# Patient Record
Sex: Female | Born: 1990 | Race: Black or African American | Hispanic: No | Marital: Single | Smoking: Never smoker
Health system: Southern US, Community
[De-identification: ages and names within clinical notes are randomized; demographics above are authoritative.]

## PROBLEM LIST (undated history)

## (undated) DIAGNOSIS — IMO0001 Reserved for inherently not codable concepts without codable children: Secondary | ICD-10-CM

## (undated) DIAGNOSIS — F419 Anxiety disorder, unspecified: Secondary | ICD-10-CM

## (undated) DIAGNOSIS — K219 Gastro-esophageal reflux disease without esophagitis: Secondary | ICD-10-CM

## (undated) DIAGNOSIS — O1490 Unspecified pre-eclampsia, unspecified trimester: Secondary | ICD-10-CM

---

## 2008-08-10 ENCOUNTER — Emergency Department (HOSPITAL_COMMUNITY): Admission: EM | Admit: 2008-08-10 | Discharge: 2008-08-10 | Payer: Self-pay | Admitting: Emergency Medicine

## 2009-10-02 IMAGING — US US OB COMP LESS 14 WK
1 series · 14 of 28 positions shown · non-contrast
Comparison: none

CLINICAL DATA: Early pregnancy.  Abnormal uterine bleeding.

OBSTETRIC <14 WK US AND TRANSVAGINAL OB US
TECHNIQUE: Both transabdominal and transvaginal ultrasound
examinations were performed for complete evaluation of the
gestation as well as the maternal uterus, adnexal regions, and
pelvic cul-de-sac.

[Series 1: unknown · 0.27mm/px · 14 of 36 slices shown]
[im 2/36]
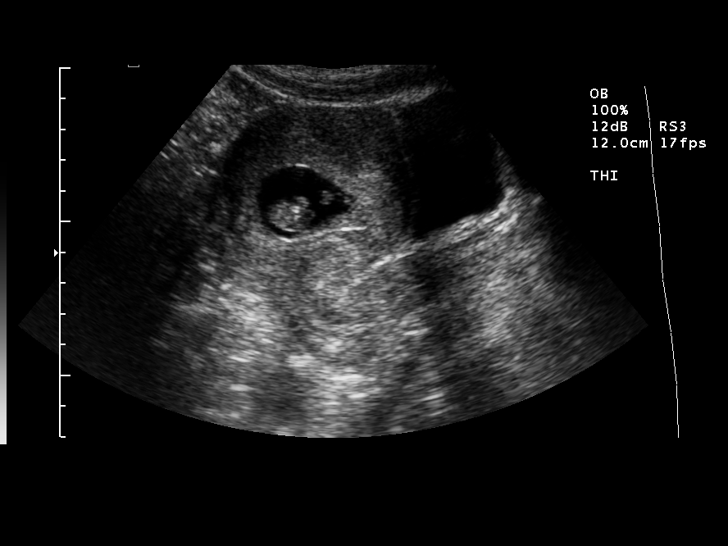
[im 4/36]
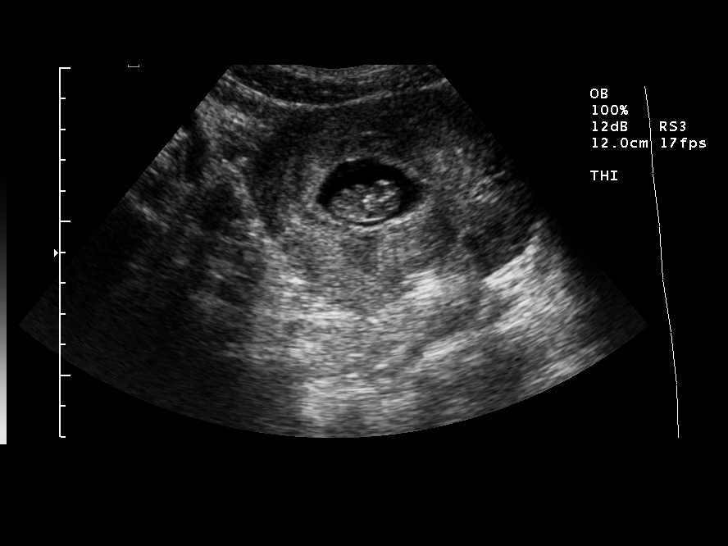
[im 7/36]
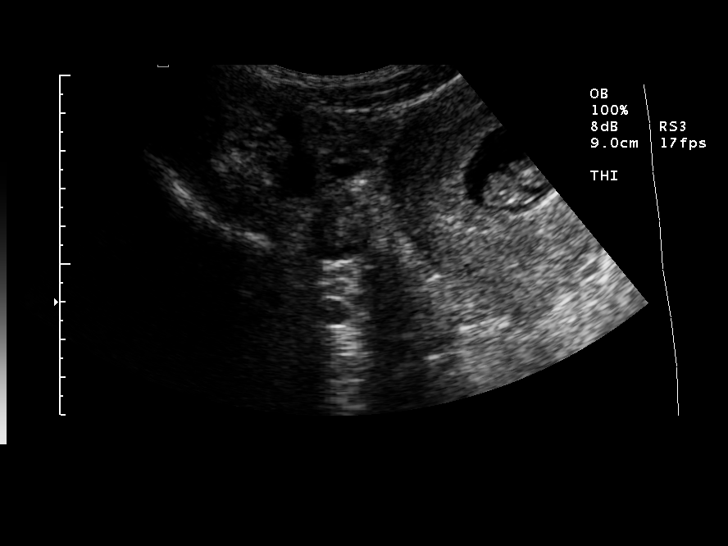
[im 10/36]
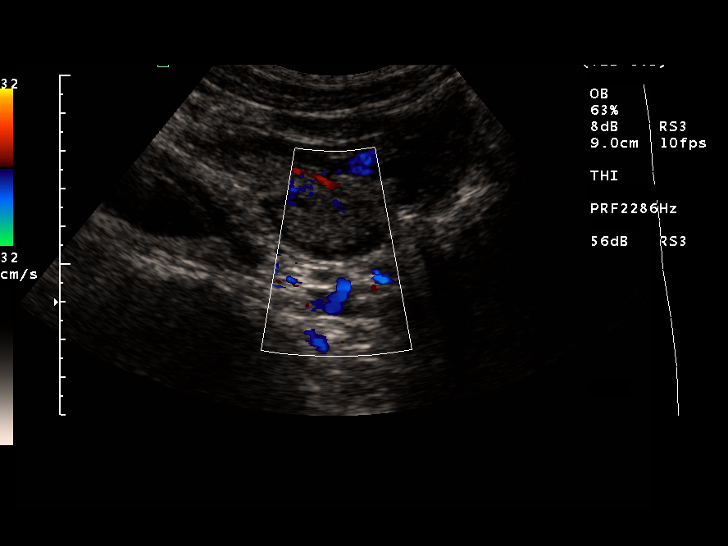
[im 12/36]
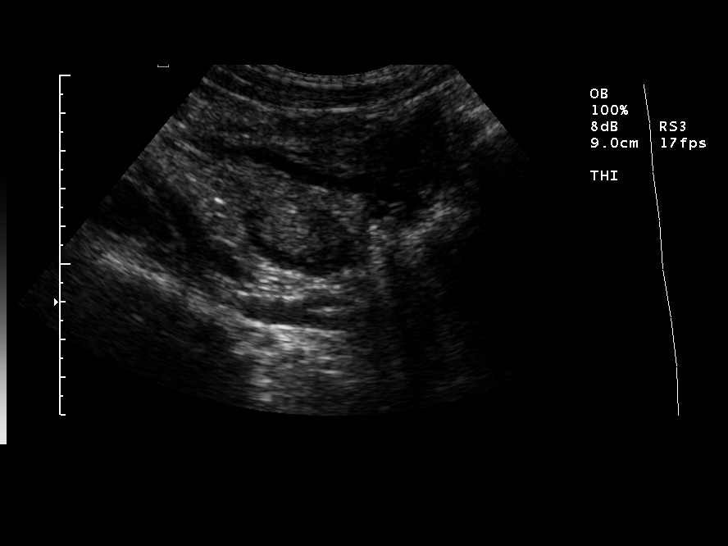
[im 15/36]
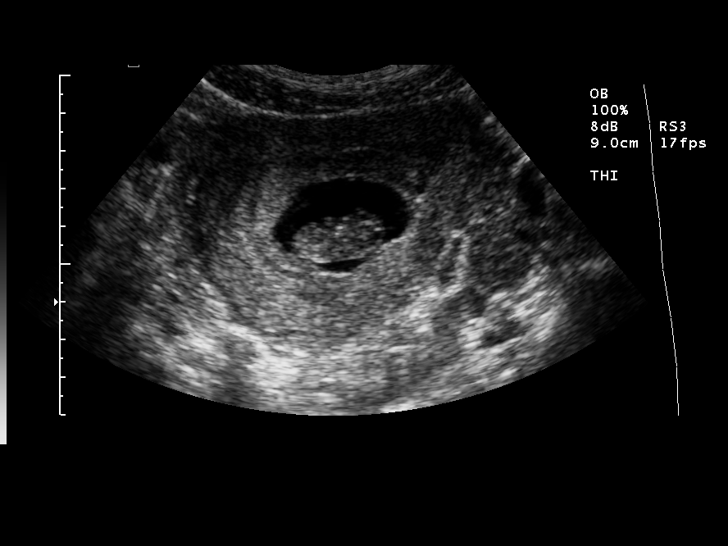
[im 17/36]
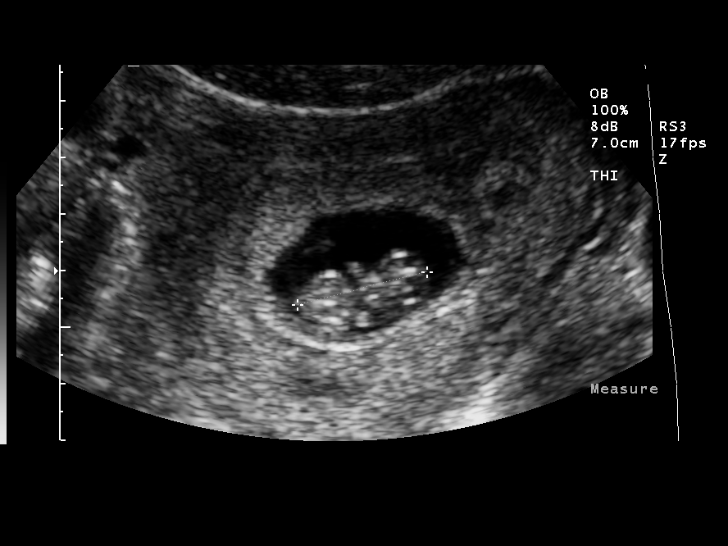
[im 20/36]
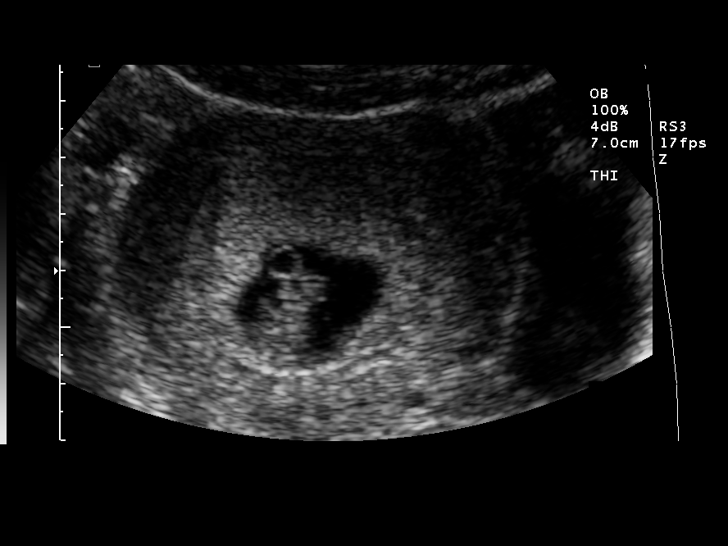
[im 23/36]
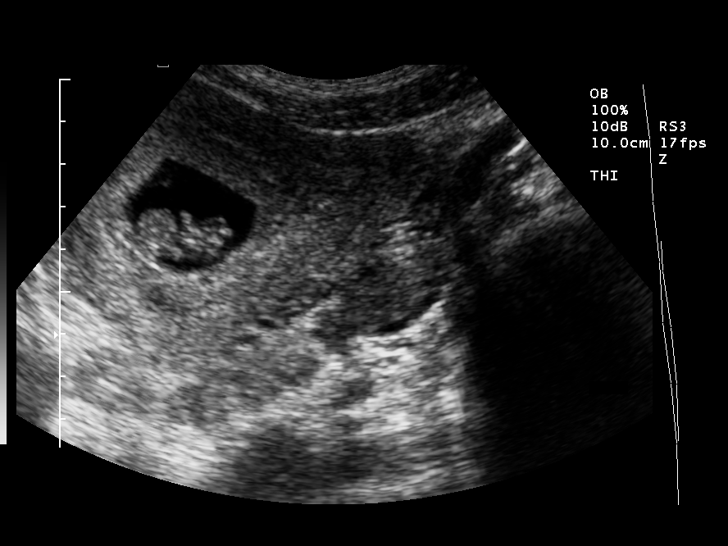
[im 25/36]
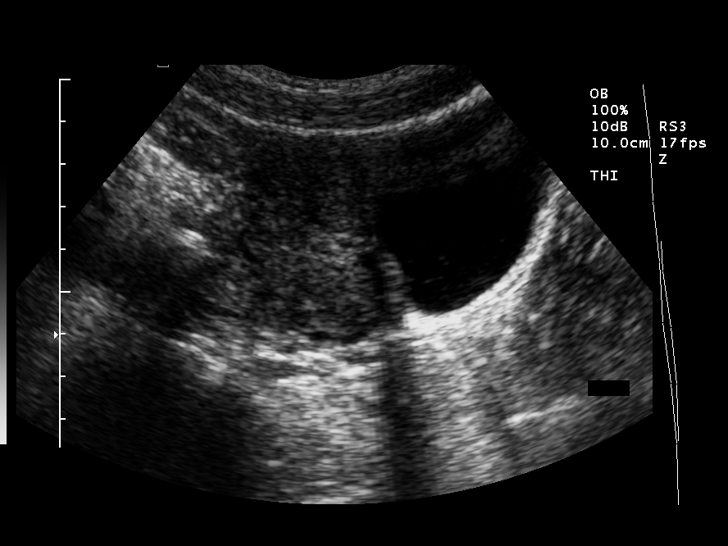
[im 28/36]
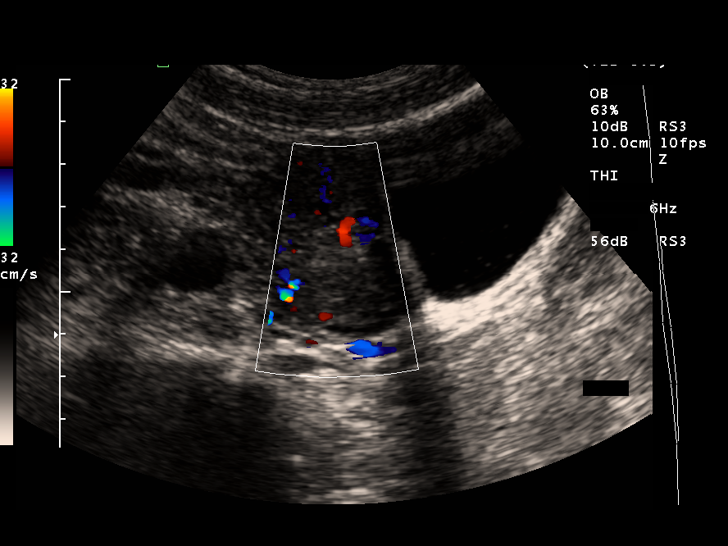
[im 30/36]
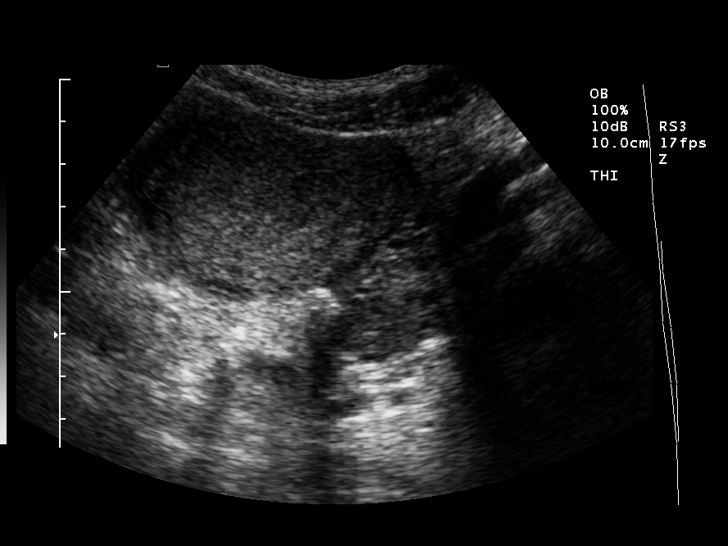
[im 33/36]
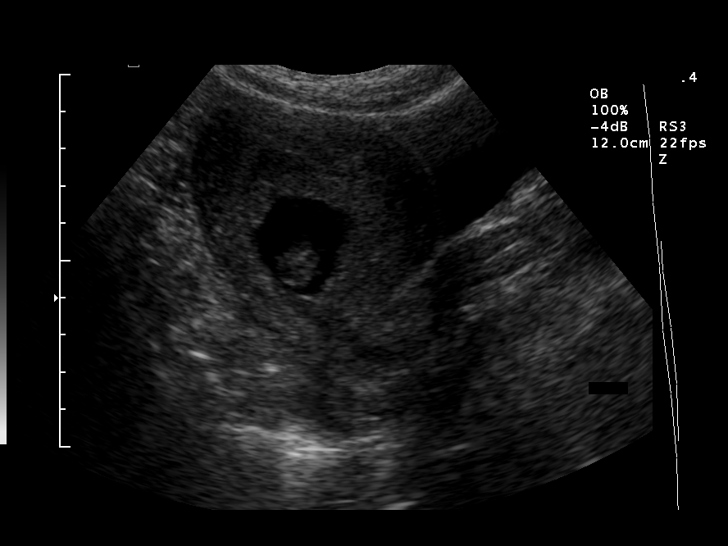
[im 36/36]
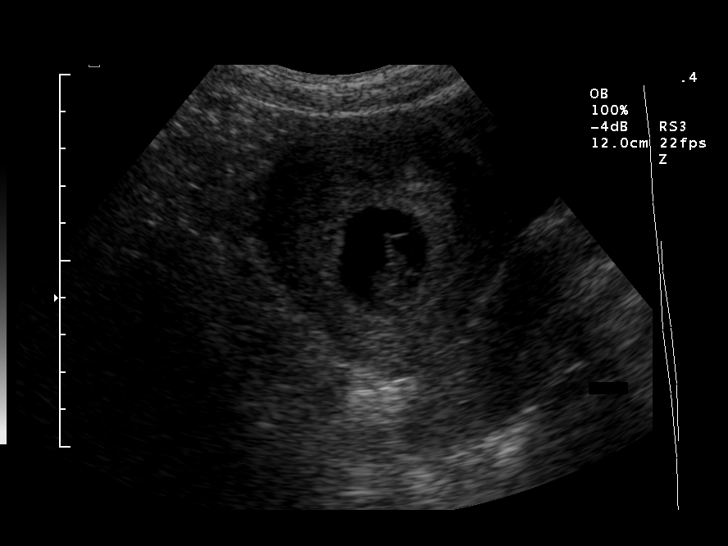

[14 of 28 positions shown; findings below may reference images not displayed]

There is a single intrauterine pregnancy.

Intrauterine gestational sac: Single
Yolk sac: Yes
Embryo: Yes
Cardiac Activity: Yes
Heart Rate: 179 bpm

CRL: 23.9           9   w  1   d          US EDC: 03/14/2009

Maternal uterus/adnexae:
There is no subchorionic hemorrhage.  The ovaries appear normal.
No free fluid.
IMPRESSION: Single intrauterine pregnancy of approximate 9 weeks 1 day
gestation.  No evidence of hemorrhage or other abnormality.

## 2011-12-03 ENCOUNTER — Encounter: Payer: Self-pay | Admitting: *Deleted

## 2011-12-03 ENCOUNTER — Emergency Department (HOSPITAL_BASED_OUTPATIENT_CLINIC_OR_DEPARTMENT_OTHER)
Admission: EM | Admit: 2011-12-03 | Discharge: 2011-12-03 | Disposition: A | Discharge status: Home / Self Care | Payer: PRIVATE HEALTH INSURANCE | Attending: Emergency Medicine | Admitting: Emergency Medicine

## 2011-12-03 DIAGNOSIS — M545 Low back pain, unspecified: Secondary | ICD-10-CM | POA: Insufficient documentation

## 2011-12-03 DIAGNOSIS — N390 Urinary tract infection, site not specified: Secondary | ICD-10-CM | POA: Insufficient documentation

## 2011-12-03 LAB — URINE MICROSCOPIC-ADD ON

## 2011-12-03 LAB — URINALYSIS, ROUTINE W REFLEX MICROSCOPIC
Glucose, UA: NEGATIVE mg/dL
Hgb urine dipstick: NEGATIVE
Specific Gravity, Urine: 1.027 (ref 1.005–1.030)

## 2011-12-03 LAB — PREGNANCY, URINE: Preg Test, Ur: NEGATIVE

## 2011-12-03 MED ORDER — SULFAMETHOXAZOLE-TMP DS 800-160 MG PO TABS: 1.0000 | ORAL_TABLET | Freq: Two times a day (BID) | ORAL | Status: AC

## 2011-12-03 MED ORDER — CYCLOBENZAPRINE HCL 5 MG PO TABS: 5.0000 mg | ORAL_TABLET | Freq: Three times a day (TID) | ORAL | Status: AC | PRN

## 2011-12-03 MED ORDER — SULFAMETHOXAZOLE-TMP DS 800-160 MG PO TABS
1.0000 | ORAL_TABLET | Freq: Once | ORAL | Status: AC
  Administered 2011-12-03: 1 via ORAL
  Filled 2011-12-03: qty 1

## 2011-12-03 MED ORDER — HYDROCODONE-ACETAMINOPHEN 5-325 MG PO TABS: 1.0000 | ORAL_TABLET | Freq: Four times a day (QID) | ORAL | Status: AC | PRN

## 2011-12-03 MED ORDER — HYDROCODONE-ACETAMINOPHEN 5-325 MG PO TABS
1.0000 | ORAL_TABLET | Freq: Once | ORAL | Status: AC
  Administered 2011-12-03: 1 via ORAL
  Filled 2011-12-03: qty 1

## 2011-12-03 NOTE — ED Provider Notes (Signed)
History     CSN: [id number] Arrival date & time: 12/03/2011  9:45 PM   First MD Initiated Contact with Patient 12/03/11 2256      Chief Complaint  Patient presents with  . Back Pain    (Consider location/radiation/quality/duration/timing/severity/associated sxs/prior treatment) HPI Patient has been having pain in her lower back for the last few days. Patient denies any injury. The pain is in her bilateral lower back. It is sharp and seems to increase with movement and palpation. She has had a cough but she has not been short of breath. She denies any dysuria or fevers. She denies any abdominal discomfort. History reviewed. No pertinent past medical history.  History reviewed. No pertinent past surgical history.  History reviewed. No pertinent family history.  History  Substance Use Topics  . Smoking status: Never Smoker   . Smokeless tobacco: Not on file  . Alcohol Use: No    OB History    Grav Para Term Preterm Abortions TAB SAB Ect Mult Living                  Review of Systems  All other systems reviewed and are negative.    Allergies  Ceclor  Home Medications   Current Outpatient Rx  Name Route Sig Dispense Refill  . ETONOGESTREL 68 MG [place] IMPL Subcutaneous Inject 1 each into the skin once.        BP 110/48  Pulse 85  Temp(Src) 98.3 F (36.8 C) (Oral)  Resp 16  Ht 5\' 7"  (1.702 m)  Wt 125 lb (56.7 kg)  BMI 19.58 kg/m2  SpO2 100%  Physical Exam  Nursing note and vitals reviewed. Constitutional: She appears well-developed and well-nourished. No distress.  HENT:  Head: Normocephalic and atraumatic.  Right Ear: External ear normal.  Left Ear: External ear normal.  Eyes: Conjunctivae are normal. Right eye exhibits no discharge. Left eye exhibits no discharge. No scleral icterus.  Neck: Neck supple. No tracheal deviation present.  Cardiovascular: Normal rate, regular rhythm and intact distal pulses.   Pulmonary/Chest: Effort normal and breath sounds  normal. No stridor. No respiratory distress. She has no wheezes. She has no rales. She exhibits no tenderness.  Abdominal: Soft. Bowel sounds are normal. She exhibits no distension and no mass. There is no tenderness. There is no rebound and no guarding.  Musculoskeletal: She exhibits no edema and no tenderness.       No CVA tenderness, mild tenderness bilateral lumbar paraspinal region  Neurological: She is alert. She has normal strength. No sensory deficit. Cranial nerve deficit:  no gross defecits noted. She exhibits normal muscle tone. She displays no seizure activity. Coordination normal.  Skin: Skin is warm and dry. No rash noted.  Psychiatric: She has a normal mood and affect.    ED Course  Procedures (including critical care time)  Labs Reviewed  URINALYSIS, ROUTINE W REFLEX MICROSCOPIC - Abnormal; Notable for the following:    Leukocytes, UA MODERATE (*)    All other components within normal limits  URINE MICROSCOPIC-ADD ON - Abnormal; Notable for the following:    Squamous Epithelial / LPF FEW (*)    Bacteria, UA FEW (*)    All other components within normal limits  PREGNANCY, URINE   No results found.    MDM  Urinalysis suggests a urinary tract infection however she does not have symptoms to suggest pyelonephritis. Symptoms suggest more of a musculoskeletal etiology. Patient will be prescribed antibiotics for the urinary tract infection as  well as pain medications and muscle relaxants.       Celene Kras, MD 12/03/11 2322

## 2011-12-03 NOTE — ED Notes (Signed)
Pt in no resp. Distress.

## 2011-12-03 NOTE — ED Notes (Signed)
C/o lower back pain x 3 days, denies injury. Pt also c/o cold sxs.

## 2011-12-04 NOTE — ED Notes (Signed)
Pts family member called back with questions re: vomitting. Explained to family that pain medication can cause some mild n/v a nd if it persists pt would need to stop taking pain meds and change to Motrin or tylenol.  Family stated understanding

## 2012-03-11 ENCOUNTER — Emergency Department (HOSPITAL_BASED_OUTPATIENT_CLINIC_OR_DEPARTMENT_OTHER)
Admission: EM | Admit: 2012-03-11 | Discharge: 2012-03-11 | Disposition: A | Discharge status: Home / Self Care | Payer: Medicaid Other | Attending: Emergency Medicine | Admitting: Emergency Medicine

## 2012-03-11 ENCOUNTER — Encounter (HOSPITAL_BASED_OUTPATIENT_CLINIC_OR_DEPARTMENT_OTHER): Payer: Self-pay | Admitting: *Deleted

## 2012-03-11 ENCOUNTER — Other Ambulatory Visit: Payer: Self-pay

## 2012-03-11 DIAGNOSIS — R071 Chest pain on breathing: Secondary | ICD-10-CM | POA: Insufficient documentation

## 2012-03-11 DIAGNOSIS — R0789 Other chest pain: Secondary | ICD-10-CM

## 2012-03-11 DIAGNOSIS — R51 Headache: Secondary | ICD-10-CM | POA: Insufficient documentation

## 2012-03-11 DIAGNOSIS — R209 Unspecified disturbances of skin sensation: Secondary | ICD-10-CM | POA: Insufficient documentation

## 2012-03-11 DIAGNOSIS — R0602 Shortness of breath: Secondary | ICD-10-CM | POA: Insufficient documentation

## 2012-03-11 HISTORY — DX: Gastro-esophageal reflux disease without esophagitis: K21.9

## 2012-03-11 HISTORY — DX: Anxiety disorder, unspecified: F41.9

## 2012-03-11 HISTORY — DX: Unspecified pre-eclampsia, unspecified trimester: O14.90

## 2012-03-11 HISTORY — DX: Reserved for inherently not codable concepts without codable children: IMO0001

## 2012-03-11 LAB — D-DIMER, QUANTITATIVE: D-Dimer, Quant: <0.22 ug/mL-FEU (ref 0.00–0.48)

## 2012-03-11 MED ORDER — HYDROCODONE-ACETAMINOPHEN 5-325 MG PO TABS: 2.0000 | ORAL_TABLET | ORAL | Status: AC | PRN

## 2012-03-11 MED ORDER — IBUPROFEN 600 MG PO TABS: 600.0000 mg | ORAL_TABLET | Freq: Four times a day (QID) | ORAL | Status: AC | PRN
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
## 2012-03-11 NOTE — Discharge Instructions (Signed)
Chest Wall Pain Chest wall pain is pain in or around the bones and muscles of your chest. It may take up to 6 weeks to get better. It may take longer if you must stay physically active in your work and activities.  CAUSES  Chest wall pain may happen on its own. However, it may be caused by:  A viral illness like the flu.   Injury.   Coughing.   Exercise.   Arthritis.   Fibromyalgia.   Shingles.  HOME CARE INSTRUCTIONS   Avoid overtiring physical activity. Try not to strain or perform activities that cause pain. This includes any activities using your chest or your abdominal and side muscles, especially if heavy weights are used.   Put ice on the sore area.   Put ice in a plastic bag.   Place a towel between your skin and the bag.   Leave the ice on for 15 to 20 minutes per hour while awake for the first 2 days.   Only take over-the-counter or prescription medicines for pain, discomfort, or fever as directed by your caregiver.  SEEK IMMEDIATE MEDICAL CARE IF:   Your pain increases, or you are very uncomfortable.   You have a fever.   Your chest pain becomes worse.   You have new, unexplained symptoms.   You have nausea or vomiting.   You feel sweaty or lightheaded.   You have a cough with phlegm (sputum), or you cough up blood.  MAKE SURE YOU:   Understand these instructions.   Will watch your condition.   Will get help right away if you are not doing well or get worse.  Document Released: 12/09/2005 Document Revised: 11/28/2011 Document Reviewed: 08/05/2011 [employer] Patient Information 2012 [employer]. 

## 2012-03-11 NOTE — ED Notes (Signed)
Patient states she has had left chest pain for the last 3 days.  Recent cold with a cough.  Describes pain as constant tightness in her left chest.

## 2012-03-11 NOTE — ED Notes (Signed)
Pt asleep when in to d/c-NAD-easily awakened

## 2012-03-12 NOTE — ED Provider Notes (Signed)
I saw and evaluated the patient, reviewed the resident's note and I agree with the findings and plan.   .Face to face Exam:  General:  Awake HEENT:  Atraumatic Resp:  Normal effort Abd:  Nondistended Neuro:No focal weakness Lymph: No adenopathy   Nelia Shi, MD 03/12/12 [phone]

## 2012-03-15 NOTE — ED Provider Notes (Signed)
I saw and evaluated the patient, reviewed the resident's note and I agree with the findings and plan.   .Face to face Exam:  General:  Awake HEENT:  Atraumatic Resp:  Normal effort Abd:  Nondistended Neuro:No focal weakness Lymph: No adenopathy   Nelia Shi, MD 03/15/12 [phone]

## 2012-09-08 ENCOUNTER — Inpatient Hospital Stay (HOSPITAL_COMMUNITY): Payer: PRIVATE HEALTH INSURANCE

## 2012-09-08 ENCOUNTER — Encounter (HOSPITAL_COMMUNITY): Payer: Self-pay | Admitting: *Deleted

## 2012-09-08 ENCOUNTER — Inpatient Hospital Stay (HOSPITAL_COMMUNITY)
Admission: AD | Admit: 2012-09-08 | Discharge: 2012-09-09 | Disposition: A | Discharge status: Home / Self Care | Payer: PRIVATE HEALTH INSURANCE | Source: Ambulatory Visit | Attending: Obstetrics & Gynecology | Admitting: Obstetrics & Gynecology

## 2012-09-08 DIAGNOSIS — N949 Unspecified condition associated with female genital organs and menstrual cycle: Secondary | ICD-10-CM | POA: Insufficient documentation

## 2012-09-08 DIAGNOSIS — N939 Abnormal uterine and vaginal bleeding, unspecified: Secondary | ICD-10-CM

## 2012-09-08 DIAGNOSIS — N938 Other specified abnormal uterine and vaginal bleeding: Secondary | ICD-10-CM | POA: Insufficient documentation

## 2012-09-08 LAB — URINE MICROSCOPIC-ADD ON

## 2012-09-08 LAB — CBC
MCV: 81.3 fL (ref 78.0–100.0)
Platelets: 187 10*3/uL (ref 150–400)
RBC: 4.27 MIL/uL (ref 3.87–5.11)
WBC: 4.3 10*3/uL (ref 4.0–10.5)

## 2012-09-08 LAB — URINALYSIS, ROUTINE W REFLEX MICROSCOPIC
Bilirubin Urine: NEGATIVE
Ketones, ur: NEGATIVE mg/dL
Protein, ur: NEGATIVE mg/dL
Specific Gravity, Urine: >1.03 — ABNORMAL HIGH (ref 1.005–1.030)
Urobilinogen, UA: 1 mg/dL (ref 0.0–1.0)

## 2012-09-08 LAB — WET PREP, GENITAL

## 2012-09-08 NOTE — MAU Provider Note (Signed)
Chief Complaint: Vaginal Bleeding   First Provider Initiated Contact with Patient 09/08/12 2244     SUBJECTIVE HPI: Frances Montoya is a 21 y.o. G1P0101 who presents to maternity admissions reporting vaginal bleeding x1 month, starting as daily spotting and becoming heavier in the last two days. She also reports increasing fatigue. Pt reports using 1 pad/day, which is like a normal menses for her.  She has an Implanon in place which expired in 3/13 but does not have a Gyn provider currently.  She has had difficulty getting appointment at health department to evaluate Implanon.  She denies vaginal itching/burning, urinary symptoms, h/a, dizziness, n/v, or fever/chills.     Past Medical History  Diagnosis Date  . Preeclampsia   . Reflux   . Anxiety    Past Surgical History  Procedure Date  . Cesarean section    History   Social History  . Marital Status: Single    Spouse Name: N/A    Number of Children: N/A  . Years of Education: N/A   Occupational History  . Not on file.   Social History Main Topics  . Smoking status: Never Smoker   . Smokeless tobacco: Not on file  . Alcohol Use: No  . Drug Use: No  . Sexually Active: Yes    Birth Control/ Protection: Implant   Other Topics Concern  . Not on file   Social History Narrative  . No narrative on file   No current facility-administered medications on file prior to encounter.   No current outpatient prescriptions on file prior to encounter.   Allergies  Allergen Reactions  . Ceclor (Cefaclor)     Childhood allergy    ROS: Pertinent items in HPI  OBJECTIVE Blood pressure 115/67, pulse 78, temperature 99 F (37.2 C), temperature source Oral, resp. rate 18, height 5\' 7"  (1.702 m), weight 58.514 kg (129 lb), last menstrual period 07/23/2012, SpO2 100.00%. GENERAL: Well-developed, well-nourished female in no acute distress.  HEENT: Normocephalic HEART: normal rate RESP: normal effort ABDOMEN: Soft,  non-tender EXTREMITIES: Nontender, no edema NEURO: Alert and oriented Pelvic exam: Cervix pink, visually closed, some ectropion but otherwise normal, scant white creamy discharge, vaginal walls and external genitalia normal Bimanual exam: Cervix 0/long/high, firm, anterior, neg CMT, uterus nontender, nonenlarged, adnexa with mild tenderness on right, none on left, no enlargement or mass bilaterally  LAB RESULTS Results for orders placed during the hospital encounter of 09/08/12 (from the past 24 hour(s))  URINALYSIS, ROUTINE W REFLEX MICROSCOPIC     Status: Abnormal   Collection Time   09/08/12  7:32 PM      Component Value Range   Color, Urine YELLOW  YELLOW   APPearance CLEAR  CLEAR   Specific Gravity, Urine >1.030 (*) 1.005 - 1.030   pH 6.0  5.0 - 8.0   Glucose, UA NEGATIVE  NEGATIVE mg/dL   Hgb urine dipstick SMALL (*) NEGATIVE   Bilirubin Urine NEGATIVE  NEGATIVE   Ketones, ur NEGATIVE  NEGATIVE mg/dL   Protein, ur NEGATIVE  NEGATIVE mg/dL   Urobilinogen, UA 1.0  0.0 - 1.0 mg/dL   Nitrite NEGATIVE  NEGATIVE   Leukocytes, UA NEGATIVE  NEGATIVE  URINE MICROSCOPIC-ADD ON     Status: Abnormal   Collection Time   09/08/12  7:32 PM      Component Value Range   Squamous Epithelial / LPF FEW (*) RARE   RBC / HPF 0-2  <3 RBC/hpf   Urine-Other MUCOUS PRESENT    POCT  PREGNANCY, URINE     Status: Normal   Collection Time   09/08/12  7:40 PM      Component Value Range   Preg Test, Ur NEGATIVE  NEGATIVE    PLAN Discharge home   Medication List     As of 09/08/2012 10:47 PM    ASK your doctor about these medications         IMPLANON 68 MG Impl implant   Generic drug: etonogestrel   Inject 1 each into the skin once.        Pelvic U/S pending Report to Dorathy Kinsman, CNM Message sent to Gyn clinic for appointment  Sharen Counter Certified Nurse-Midwife 09/08/2012  10:47 PM

## 2012-09-08 NOTE — MAU Note (Signed)
Pt states she has been having some vaginal bleeding for about 1 month- 6 wks . Pt states she started feeling bad today. Headache and chills.

## 2012-09-08 NOTE — Progress Notes (Signed)
Pt states she has been bleeding everyday. Pt states she is only using 1 pad a day.

## 2012-09-08 NOTE — MAU Note (Signed)
Pt reports vaginal bleeding x 1.5 months, states her Implanod expired in 03/13. For last few days has had lower abd pain, chills, and headache. Only using one pad per day. Not using any birthcontrol now.Marland Kitchen

## 2013-04-07 ENCOUNTER — Emergency Department (HOSPITAL_BASED_OUTPATIENT_CLINIC_OR_DEPARTMENT_OTHER)
Admission: EM | Admit: 2013-04-07 | Discharge: 2013-04-07 | Disposition: A | Discharge status: Home / Self Care | Payer: Medicaid Other | Attending: Emergency Medicine | Admitting: Emergency Medicine

## 2013-04-07 ENCOUNTER — Emergency Department (HOSPITAL_BASED_OUTPATIENT_CLINIC_OR_DEPARTMENT_OTHER): Payer: Medicaid Other

## 2013-04-07 ENCOUNTER — Encounter (HOSPITAL_BASED_OUTPATIENT_CLINIC_OR_DEPARTMENT_OTHER): Payer: Self-pay | Admitting: *Deleted

## 2013-04-07 DIAGNOSIS — R0602 Shortness of breath: Secondary | ICD-10-CM | POA: Insufficient documentation

## 2013-04-07 DIAGNOSIS — Z8719 Personal history of other diseases of the digestive system: Secondary | ICD-10-CM | POA: Insufficient documentation

## 2013-04-07 DIAGNOSIS — R0789 Other chest pain: Secondary | ICD-10-CM | POA: Insufficient documentation

## 2013-04-07 DIAGNOSIS — J40 Bronchitis, not specified as acute or chronic: Secondary | ICD-10-CM | POA: Insufficient documentation

## 2013-04-07 DIAGNOSIS — Z8659 Personal history of other mental and behavioral disorders: Secondary | ICD-10-CM | POA: Insufficient documentation

## 2013-04-07 MED ORDER — ALBUTEROL SULFATE HFA 108 (90 BASE) MCG/ACT IN AERS
INHALATION_SPRAY | RESPIRATORY_TRACT | Status: AC
  Administered 2013-04-07: 2 via RESPIRATORY_TRACT
  Filled 2013-04-07: qty 6.7

## 2013-04-07 MED ORDER — ALBUTEROL SULFATE HFA 108 (90 BASE) MCG/ACT IN AERS
2.0000 | INHALATION_SPRAY | RESPIRATORY_TRACT | Status: DC | PRN
  Administered 2013-04-07: 2 via RESPIRATORY_TRACT

## 2013-04-07 MED ORDER — AEROCHAMBER PLUS FLO-VU MEDIUM MISC
1.0000 | Freq: Once | Status: AC
  Administered 2013-04-07: 1
  Filled 2013-04-07: qty 1

## 2013-04-07 NOTE — ED Provider Notes (Signed)
History     CSN: [id number]  Arrival date & time 04/07/13  2215   First MD Initiated Contact with Patient 04/07/13 2253      Chief Complaint  Patient presents with  . URI    (Consider location/radiation/quality/duration/timing/severity/associated sxs/prior treatment) HPI Presents with feeling of diffuse chest tightness and cough for the past 3 days. Symptoms accompanied by mild shortness of breath no sneeze no rhinorrhea treated with vinegar without relief. No other associated symptoms. No fever. Nothing makes symptoms better or worse Past Medical History  Diagnosis Date  . Preeclampsia   . Reflux   . Anxiety     Past Surgical History  Procedure Laterality Date  . Cesarean section      Family History  Problem Relation Age of Onset  . Hypertension Mother     History  Substance Use Topics  . Smoking status: Never Smoker   . Smokeless tobacco: Not on file  . Alcohol Use: No    OB History   Grav Para Term Preterm Abortions TAB SAB Ect Mult Living   1 1 0 1 0 0 0 0 0 1       Review of Systems  Constitutional: Negative.   HENT: Negative.   Respiratory: Positive for cough, chest tightness and shortness of breath.   Cardiovascular: Negative.   Gastrointestinal: Negative.   Musculoskeletal: Negative.   Skin: Negative.   Neurological: Negative.   Psychiatric/Behavioral: Negative.   All other systems reviewed and are negative.    Allergies  Ceclor  Home Medications  No current outpatient prescriptions on file.  BP 113/76  Pulse 73  Temp(Src) 98.1 F (36.7 C) (Oral)  Resp 16  Ht 5\' 7"  (1.702 m)  Wt 129 lb (58.514 kg)  BMI 20.2 kg/m2  SpO2 99%  LMP 03/31/2013  Physical Exam  Nursing note and vitals reviewed. Constitutional: She appears well-developed and well-nourished.  HENT:  Head: Normocephalic and atraumatic.  Eyes: Conjunctivae are normal. Pupils are equal, round, and reactive to light.  Neck: Neck supple. No tracheal deviation present. No  thyromegaly present.  Cardiovascular: Normal rate and regular rhythm.   No murmur heard. Pulmonary/Chest: Effort normal and breath sounds normal.  Abdominal: Soft. Bowel sounds are normal. She exhibits no distension. There is no tenderness.  Musculoskeletal: Normal range of motion. She exhibits no edema and no tenderness.  Neurological: She is alert. Coordination normal.  Skin: Skin is warm and dry. No rash noted.  Psychiatric: She has a normal mood and affect.    ED Course  Procedures (including critical care time)  Labs Reviewed - No data to display No results found.   No diagnosis found. Results for orders placed during the hospital encounter of 09/08/12  WET PREP, GENITAL      Result Value Range   Yeast Wet Prep HPF POC NONE SEEN  NONE SEEN   Trich, Wet Prep NONE SEEN  NONE SEEN   Clue Cells Wet Prep HPF POC MANY (*) NONE SEEN   WBC, Wet Prep HPF POC FEW (*) NONE SEEN  URINALYSIS, ROUTINE W REFLEX MICROSCOPIC      Result Value Range   Color, Urine YELLOW  YELLOW   APPearance CLEAR  CLEAR   Specific Gravity, Urine >1.030 (*) 1.005 - 1.030   pH 6.0  5.0 - 8.0   Glucose, UA NEGATIVE  NEGATIVE mg/dL   Hgb urine dipstick SMALL (*) NEGATIVE   Bilirubin Urine NEGATIVE  NEGATIVE   Ketones, ur NEGATIVE  NEGATIVE mg/dL  Protein, ur NEGATIVE  NEGATIVE mg/dL   Urobilinogen, UA 1.0  0.0 - 1.0 mg/dL   Nitrite NEGATIVE  NEGATIVE   Leukocytes, UA NEGATIVE  NEGATIVE  URINE MICROSCOPIC-ADD ON      Result Value Range   Squamous Epithelial / LPF FEW (*) RARE   RBC / HPF 0-2  <3 RBC/hpf   Urine-Other MUCOUS PRESENT    CBC      Result Value Range   WBC 4.3  4.0 - 10.5 K/uL   RBC 4.27  3.87 - 5.11 MIL/uL   Hemoglobin 11.3 (*) 12.0 - 15.0 g/dL   HCT 16.1 (*) 09.6 - 04.5 %   MCV 81.3  78.0 - 100.0 fL   MCH 26.5  26.0 - 34.0 pg   MCHC 32.6  30.0 - 36.0 g/dL   RDW 40.9  81.1 - 91.4 %   Platelets 187  150 - 400 K/uL  GC/CHLAMYDIA PROBE AMP, GENITAL      Result Value Range   GC  Probe Amp, Genital NEGATIVE  NEGATIVE   Chlamydia, DNA Probe NEGATIVE  NEGATIVE  POCT PREGNANCY, URINE      Result Value Range   Preg Test, Ur NEGATIVE  NEGATIVE   Dg Chest 2 View  04/07/2013  *RADIOLOGY REPORT*  Clinical Data: Cough and shortness of breath.  Chest congestion.  CHEST - 2 VIEW  Comparison: None.  Findings: Heart size and vascularity are normal and the lungs are clear.  Moderate thoracolumbar scoliosis.  IMPRESSION: No acute abnormality.   Original Report Authenticated By: Francene Boyers, M.D.      Chest x-ray viewed by me and discussed with Dr. Jena Gauss.  MDM  Plan albuterol inhaler with spacer to use 2 puffs every 4 hours when necessary for cough or shortness of breath. Referral resource guide. Diagnosis bronchitis        Doug Sou, MD 04/07/13 [phone]

## 2013-04-07 NOTE — ED Notes (Signed)
MD at bedside. 

## 2013-04-07 NOTE — ED Notes (Signed)
Pt c/o cough x 3 days with chest congestin and discomfort

## 2013-10-31 IMAGING — US US TRANSVAGINAL NON-OB
1 series · 14 of 25 positions shown · non-contrast
Comparison: None

CLINICAL DATA: Vaginal bleeding for 1 month.



[Series 1: us pelvis complete · 14 of 38 slices shown]
[im 1/38]
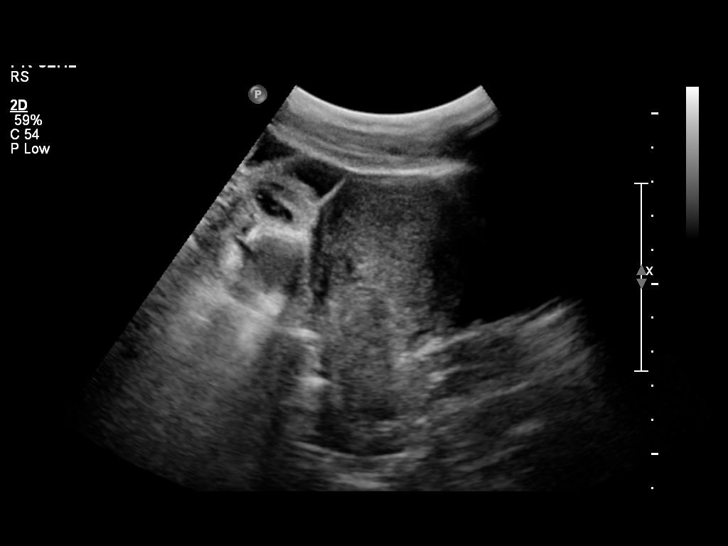
[im 4/38]
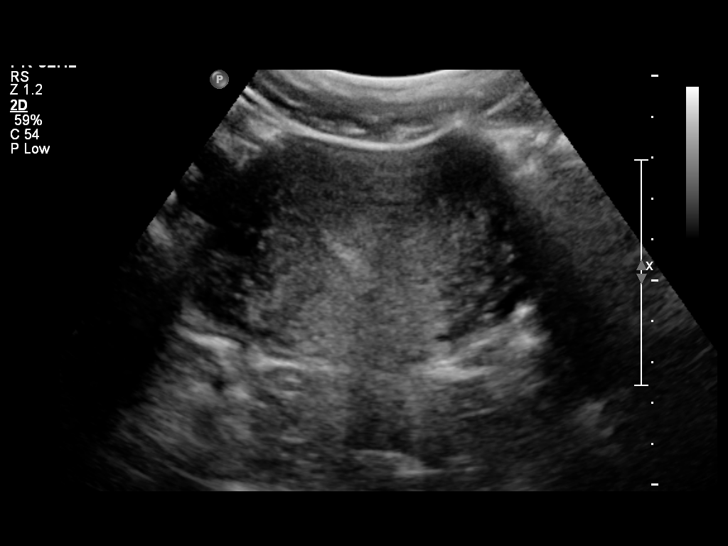
[im 7/38]
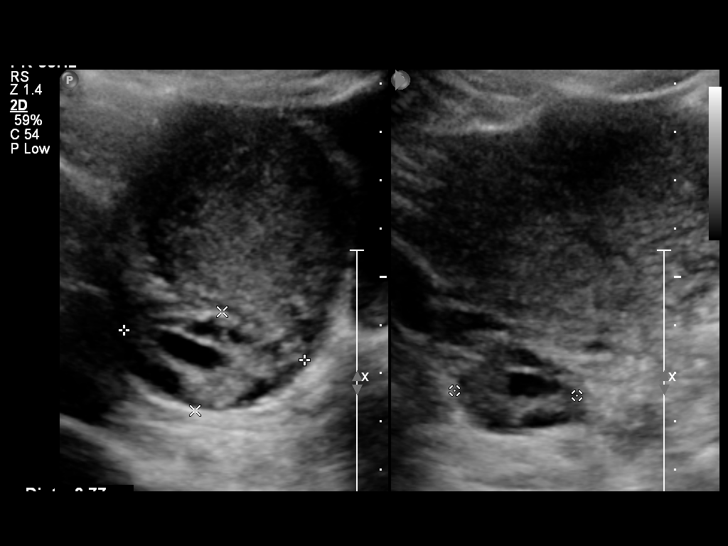
[im 10/38]
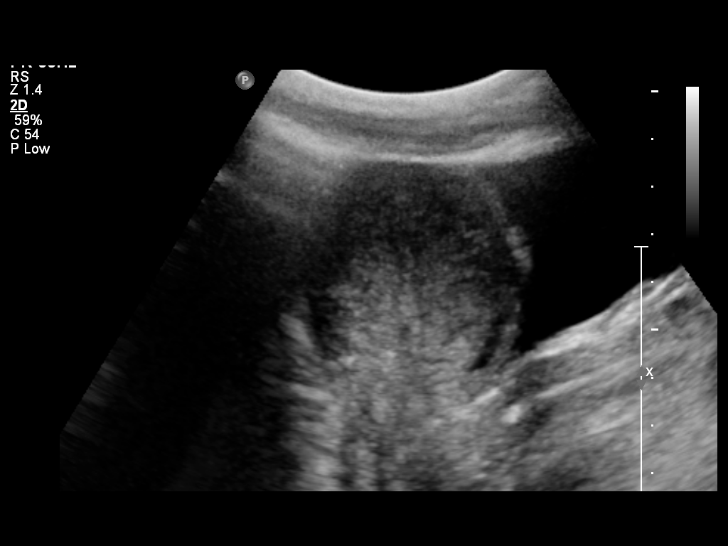
[im 13/38]
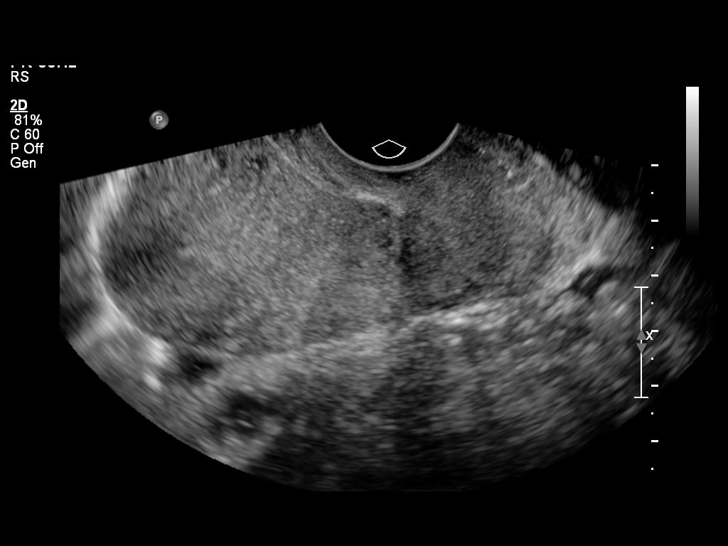
[im 14/38]
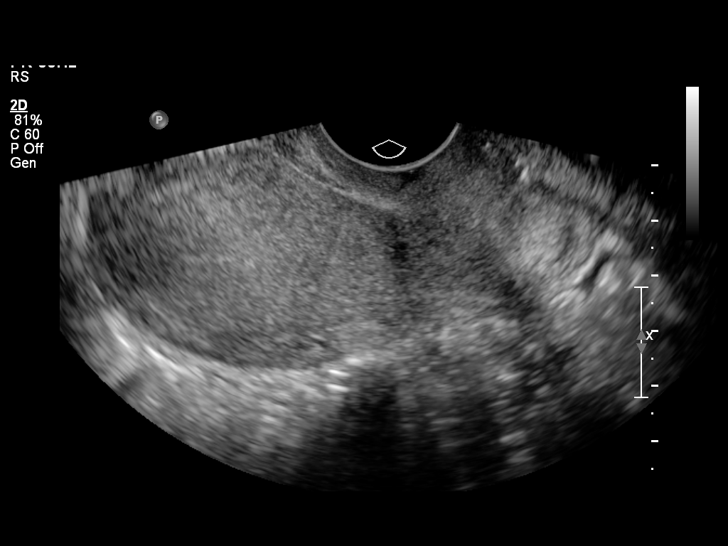
[im 17/38]
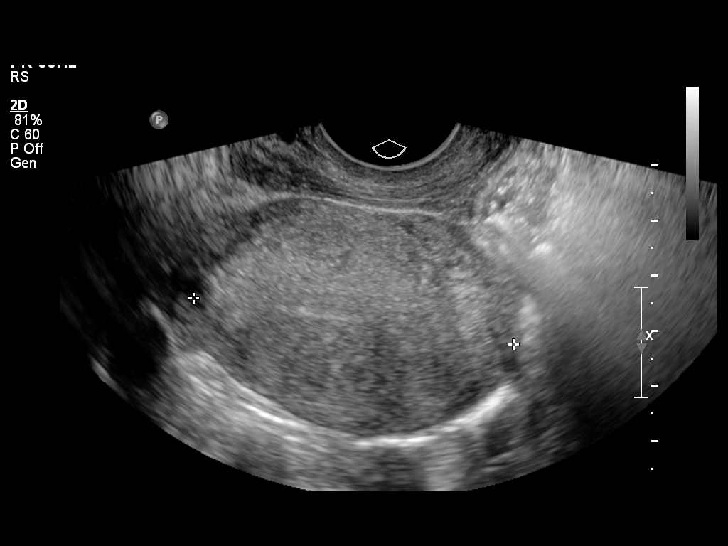
[im 21/38]
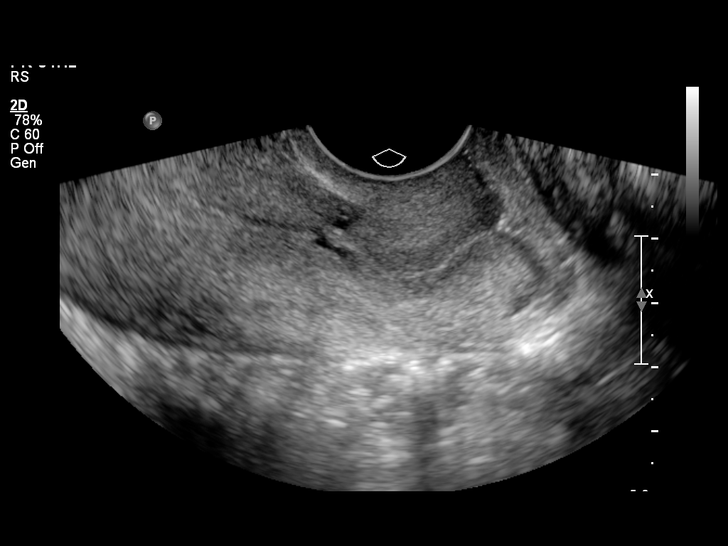
[im 24/38]
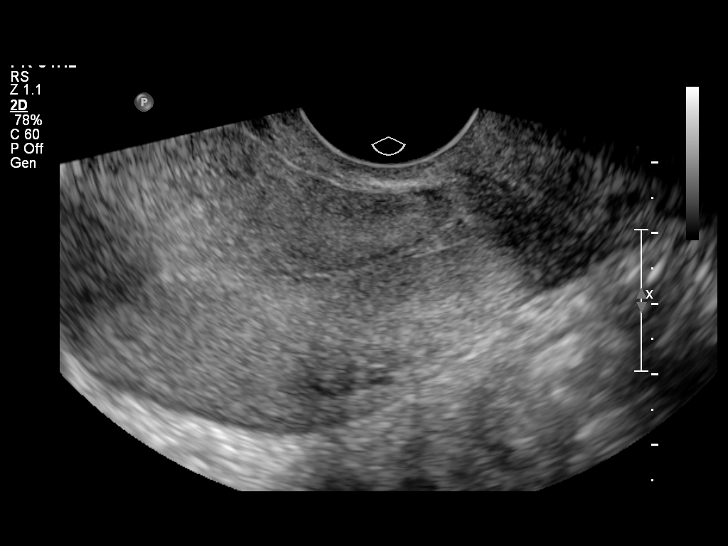
[im 25/38]
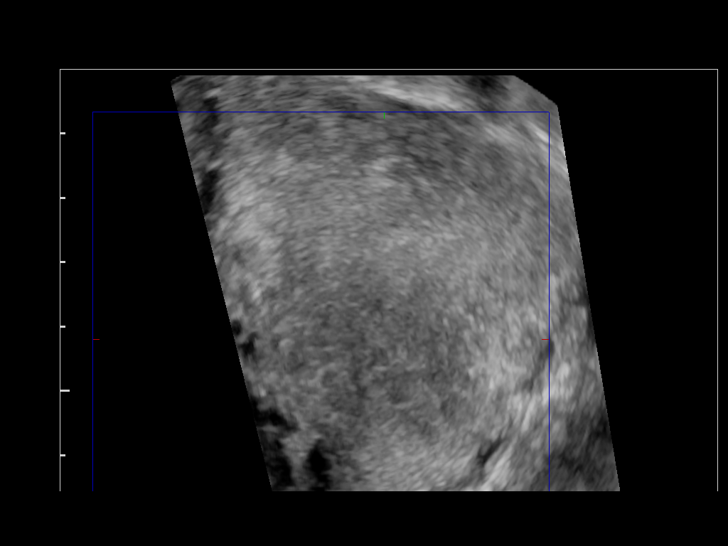
[im 28/38]
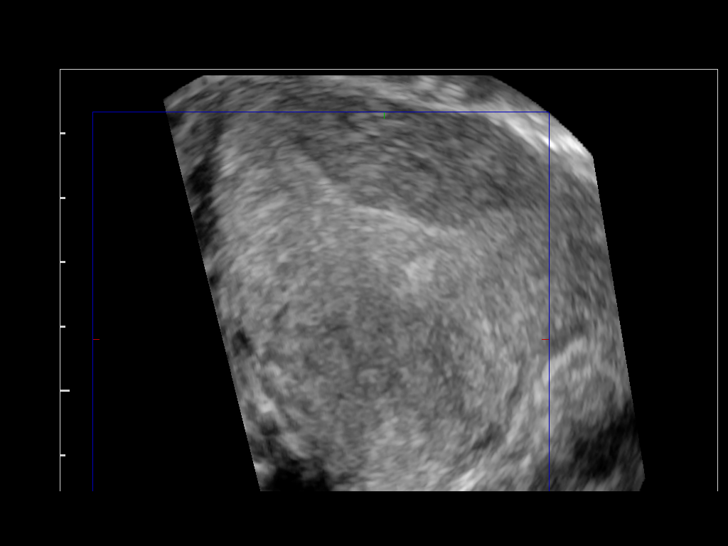
[im 31/38]
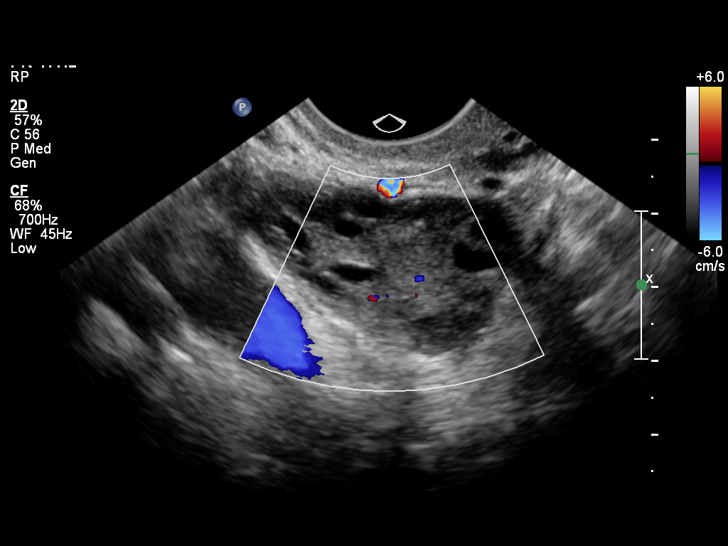
[im 34/38]
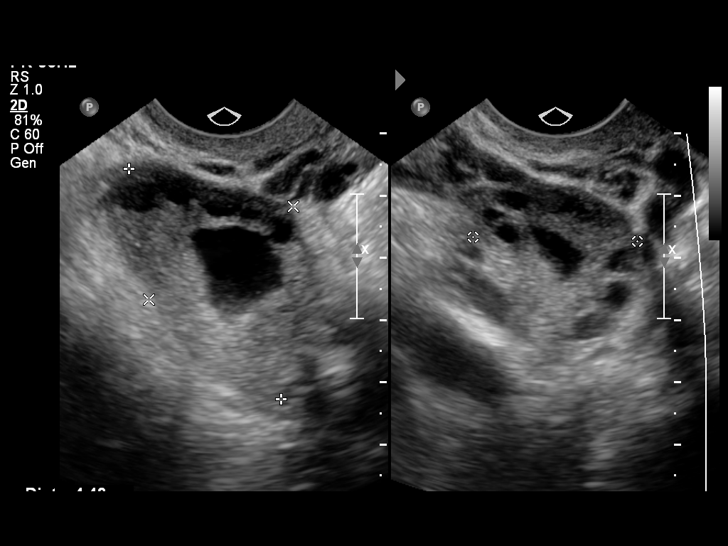
[im 38/38]
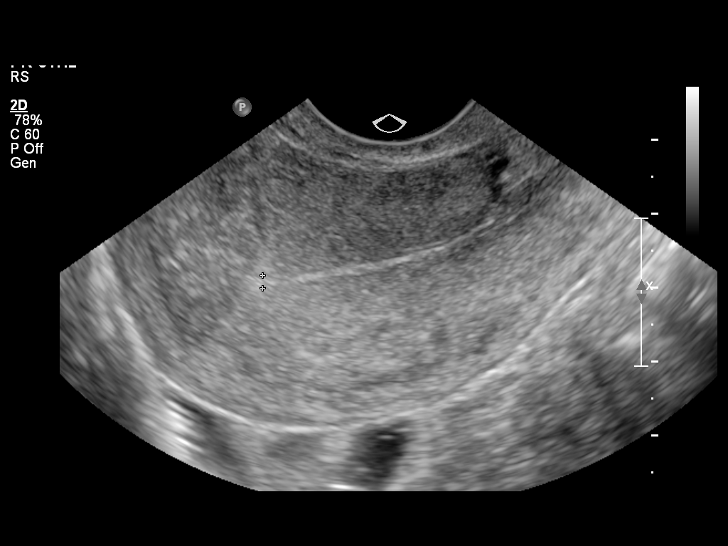

[14 of 25 positions shown; findings below may reference images not displayed]

FINDINGS: Uterus: Normal in size and appearance

Endometrium: Normal in thickness and appearance

Right ovary:  Normal appearance/no adnexal mass

Left ovary: Normal appearance/no adnexal mass

Other findings: No free fluid
IMPRESSION: Normal study. No evidence of pelvic mass or other significant
abnormality.

## 2014-01-31 ENCOUNTER — Emergency Department (INDEPENDENT_AMBULATORY_CARE_PROVIDER_SITE_OTHER)
Admission: EM | Admit: 2014-01-31 | Discharge: 2014-01-31 | Disposition: A | Discharge status: Home / Self Care | Payer: Self-pay | Source: Home / Self Care | Attending: Family Medicine | Admitting: Family Medicine

## 2014-01-31 ENCOUNTER — Encounter (HOSPITAL_COMMUNITY): Payer: Self-pay | Admitting: Emergency Medicine

## 2014-01-31 DIAGNOSIS — M722 Plantar fascial fibromatosis: Secondary | ICD-10-CM

## 2014-01-31 MED ORDER — DICLOFENAC POTASSIUM 50 MG PO TABS: 50.0000 mg | ORAL_TABLET | Freq: Three times a day (TID) | ORAL | Status: DC

## 2014-01-31 NOTE — ED Provider Notes (Signed)
CSN: [id number]     Arrival date & time 01/31/14  1856 History   First MD Initiated Contact with Patient 01/31/14 2024     Chief Complaint  Patient presents with  . Foot Pain     (Consider location/radiation/quality/duration/timing/severity/associated sxs/prior Treatment) Patient is a 23 y.o. female presenting with lower extremity pain. The history is provided by the patient.  Foot Pain This is a new problem. The current episode started more than 1 week ago (78mo problem, worse recently.). The problem has been gradually worsening. The symptoms are aggravated by walking.    Past Medical History  Diagnosis Date  . Preeclampsia   . Reflux   . Anxiety    Past Surgical History  Procedure Laterality Date  . Cesarean section     Family History  Problem Relation Age of Onset  . Hypertension Mother    History  Substance Use Topics  . Smoking status: Never Smoker   . Smokeless tobacco: Not on file  . Alcohol Use: No   OB History   Grav Para Term Preterm Abortions TAB SAB Ect Mult Living   1 1 0 1 0 0 0 0 0 1      Review of Systems  Constitutional: Negative.   Musculoskeletal: Positive for gait problem. Negative for back pain.  Skin: Negative.       Allergies  Ceclor  Home Medications   Current Outpatient Rx  Name  Route  Sig  Dispense  Refill  . diclofenac (CATAFLAM) 50 MG tablet   Oral   Take 1 tablet (50 mg total) by mouth 3 (three) times daily.   30 tablet   0    BP 113/75  Pulse 74  Temp(Src) 97.5 F (36.4 C) (Oral)  Resp 18  SpO2 99%  LMP 01/06/2014 Physical Exam  Nursing note and vitals reviewed. Constitutional: She is oriented to person, place, and time. She appears well-developed and well-nourished.  Musculoskeletal: She exhibits tenderness.       Feet:  Neurological: She is alert and oriented to person, place, and time.  Skin: Skin is warm and dry.    ED Course  Procedures (including critical care time) Labs Review Labs Reviewed - No data  to display Imaging Review No results found.    MDM   Final diagnoses:  Plantar fasciitis, bilateral        Linna HoffJames D Kindl, MD 01/31/14 2050

## 2014-01-31 NOTE — Discharge Instructions (Signed)
Ice and medicine and no barefoot as discussed, see podiatrist if further problems.

## 2014-01-31 NOTE — ED Notes (Signed)
Pt c/o bilateral heel pain onset 2 weeks Works in a daycare and constantly on feet; concrete flooring Pain is constant and increases w/activity Denies inj/trauma Alert w/no signs of acute distress.

## 2014-04-30 ENCOUNTER — Emergency Department (HOSPITAL_COMMUNITY)
Admission: EM | Admit: 2014-04-30 | Discharge: 2014-04-30 | Disposition: A | Discharge status: Home / Self Care | Payer: Self-pay | Attending: Emergency Medicine | Admitting: Emergency Medicine

## 2014-04-30 ENCOUNTER — Encounter (HOSPITAL_COMMUNITY): Payer: Self-pay | Admitting: Emergency Medicine

## 2014-04-30 DIAGNOSIS — Z8719 Personal history of other diseases of the digestive system: Secondary | ICD-10-CM | POA: Insufficient documentation

## 2014-04-30 DIAGNOSIS — R197 Diarrhea, unspecified: Secondary | ICD-10-CM | POA: Insufficient documentation

## 2014-04-30 DIAGNOSIS — Z3202 Encounter for pregnancy test, result negative: Secondary | ICD-10-CM | POA: Insufficient documentation

## 2014-04-30 DIAGNOSIS — Z8659 Personal history of other mental and behavioral disorders: Secondary | ICD-10-CM | POA: Insufficient documentation

## 2014-04-30 DIAGNOSIS — R110 Nausea: Secondary | ICD-10-CM | POA: Insufficient documentation

## 2014-04-30 LAB — URINALYSIS, ROUTINE W REFLEX MICROSCOPIC
GLUCOSE, UA: NEGATIVE mg/dL
Hgb urine dipstick: NEGATIVE
KETONES UR: 40 mg/dL — AB
LEUKOCYTES UA: NEGATIVE
NITRITE: NEGATIVE
PH: 6 (ref 5.0–8.0)
Protein, ur: NEGATIVE mg/dL
SPECIFIC GRAVITY, URINE: 1.036 — AB (ref 1.005–1.030)
Urobilinogen, UA: 1 mg/dL (ref 0.0–1.0)

## 2014-04-30 LAB — CBC WITH DIFFERENTIAL/PLATELET
BASOS ABS: 0 10*3/uL (ref 0.0–0.1)
BASOS PCT: 0 % (ref 0–1)
EOS ABS: 0 10*3/uL (ref 0.0–0.7)
EOS PCT: 0 % (ref 0–5)
HCT: 36 % (ref 36.0–46.0)
Hemoglobin: 12.1 g/dL (ref 12.0–15.0)
LYMPHS PCT: 14 % (ref 12–46)
Lymphs Abs: 0.5 10*3/uL — ABNORMAL LOW (ref 0.7–4.0)
MCH: 27.2 pg (ref 26.0–34.0)
MCHC: 33.6 g/dL (ref 30.0–36.0)
MCV: 80.9 fL (ref 78.0–100.0)
MONO ABS: 0.3 10*3/uL (ref 0.1–1.0)
Monocytes Relative: 8 % (ref 3–12)
Neutro Abs: 2.8 10*3/uL (ref 1.7–7.7)
Neutrophils Relative %: 77 % (ref 43–77)
PLATELETS: 184 10*3/uL (ref 150–400)
RBC: 4.45 MIL/uL (ref 3.87–5.11)
RDW: 12.5 % (ref 11.5–15.5)
WBC: 3.6 10*3/uL — AB (ref 4.0–10.5)

## 2014-04-30 LAB — COMPREHENSIVE METABOLIC PANEL
ALBUMIN: 4 g/dL (ref 3.5–5.2)
ALT: 9 U/L (ref 0–35)
AST: 17 U/L (ref 0–37)
Alkaline Phosphatase: 42 U/L (ref 39–117)
BUN: 16 mg/dL (ref 6–23)
CALCIUM: 9.2 mg/dL (ref 8.4–10.5)
CO2: 22 meq/L (ref 19–32)
CREATININE: 0.71 mg/dL (ref 0.50–1.10)
Chloride: 101 mEq/L (ref 96–112)
GFR calc Af Amer: >90 mL/min (ref >90)
GFR calc non Af Amer: >90 mL/min (ref >90)
Glucose, Bld: 94 mg/dL (ref 70–99)
Potassium: 3.6 mEq/L — ABNORMAL LOW (ref 3.7–5.3)
SODIUM: 137 meq/L (ref 137–147)
TOTAL PROTEIN: 7.7 g/dL (ref 6.0–8.3)
Total Bilirubin: 0.6 mg/dL (ref 0.3–1.2)

## 2014-04-30 LAB — LIPASE, BLOOD: Lipase: 27 U/L (ref 11–59)

## 2014-04-30 LAB — POC URINE PREG, ED: PREG TEST UR: NEGATIVE

## 2014-04-30 MED ORDER — MORPHINE SULFATE 4 MG/ML IJ SOLN: 4.0000 mg | Freq: Once | INTRAMUSCULAR | Status: DC

## 2014-04-30 MED ORDER — SODIUM CHLORIDE 0.9 % IV BOLUS (SEPSIS)
2000.0000 mL | Freq: Once | INTRAVENOUS | Status: AC
  Administered 2014-04-30: 2000 mL via INTRAVENOUS

## 2014-04-30 MED ORDER — DICYCLOMINE HCL 20 MG PO TABS: 20.0000 mg | ORAL_TABLET | Freq: Two times a day (BID) | ORAL | Status: AC

## 2014-04-30 MED ORDER — ONDANSETRON 4 MG PO TBDP: 4.0000 mg | ORAL_TABLET | Freq: Three times a day (TID) | ORAL | Status: AC | PRN

## 2014-04-30 MED ORDER — ONDANSETRON HCL 4 MG/2ML IJ SOLN
4.0000 mg | Freq: Once | INTRAMUSCULAR | Status: AC
  Administered 2014-04-30: 4 mg via INTRAVENOUS
  Filled 2014-04-30: qty 2

## 2014-04-30 MED ORDER — DIPHENOXYLATE-ATROPINE 2.5-0.025 MG PO TABS
2.0000 | ORAL_TABLET | Freq: Once | ORAL | Status: AC
  Administered 2014-04-30: 2 via ORAL
  Filled 2014-04-30: qty 2

## 2014-04-30 NOTE — Discharge Instructions (Signed)
Rest, and stay hydrated. Take Lomotil for diarrhea.  Use Bentyl for cramps. Return here with fever, worsening pain, bloody diarrhea, or other changes.  Diarrhea Diarrhea is frequent loose and watery bowel movements. It can cause you to feel weak and dehydrated. Dehydration can cause you to become tired and thirsty, have a dry mouth, and have decreased urination that often is dark yellow. Diarrhea is a sign of another problem, most often an infection that will not last long. In most cases, diarrhea typically lasts 2 3 days. However, it can last longer if it is a sign of something more serious. It is important to treat your diarrhea as directed by your caregive to lessen or prevent future episodes of diarrhea. CAUSES  Some common causes include:  Gastrointestinal infections caused by viruses, bacteria, or parasites.  Food poisoning or food allergies.  Certain medicines, such as antibiotics, chemotherapy, and laxatives.  Artificial sweeteners and fructose.  Digestive disorders. HOME CARE INSTRUCTIONS  Ensure adequate fluid intake (hydration): have 1 cup (8 oz) of fluid for each diarrhea episode. Avoid fluids that contain simple sugars or sports drinks, fruit juices, whole milk products, and sodas. Your urine should be clear or pale yellow if you are drinking enough fluids. Hydrate with an oral rehydration solution that you can purchase at pharmacies, retail stores, and online. You can prepare an oral rehydration solution at home by mixing the following ingredients together:    tsp table salt.   tsp baking soda.   tsp salt substitute containing potassium chloride.  1  tablespoons sugar.  1 L (34 oz) of water.  Certain foods and beverages may increase the speed at which food moves through the gastrointestinal (GI) tract. These foods and beverages should be avoided and include:  Caffeinated and alcoholic beverages.  High-fiber foods, such as raw fruits and vegetables, nuts, seeds, and  whole grain breads and cereals.  Foods and beverages sweetened with sugar alcohols, such as xylitol, sorbitol, and mannitol.  Some foods may be well tolerated and may help thicken stool including:  Starchy foods, such as rice, toast, pasta, low-sugar cereal, oatmeal, grits, baked potatoes, crackers, and bagels.  Bananas.  Applesauce.  Add probiotic-rich foods to help increase healthy bacteria in the GI tract, such as yogurt and fermented milk products.  Wash your hands well after each diarrhea episode.  Only take over-the-counter or prescription medicines as directed by your caregiver.  Take a warm bath to relieve any burning or pain from frequent diarrhea episodes. SEEK IMMEDIATE MEDICAL CARE IF:   You are unable to keep fluids down.  You have persistent vomiting.  You have blood in your stool, or your stools are black and tarry.  You do not urinate in 6 8 hours, or there is only a small amount of very dark urine.  You have abdominal pain that increases or localizes.  You have weakness, dizziness, confusion, or lightheadedness.  You have a severe headache.  Your diarrhea gets worse or does not get better.  You have a fever or persistent symptoms for more than 2 3 days.  You have a fever and your symptoms suddenly get worse. MAKE SURE YOU:   Understand these instructions.  Will watch your condition.  Will get help right away if you are not doing well or get worse. Document Released: 11/29/2002 Document Revised: 11/25/2012 Document Reviewed: 08/16/2012 [facility] Patient Information 2014 [place], [place].

## 2014-04-30 NOTE — ED Provider Notes (Signed)
CSN: [id number]     Arrival date & time 04/30/14  1723 History   First MD Initiated Contact with Patient 04/30/14 1746     Chief Complaint  Patient presents with  . Diarrhea  . Nausea      HPI  Patient presents with diarrhea. She states she ate dinner last night of the event. Had one or 2 drinks of alcohol. Not been that started having diarrhea.  His episode about once per hour. No blood. She felt tingly in her lower legs. No friction the symptoms. No back pain. She is ambulatory. No blood pressure mucus in her stools. Nausea no vomiting. No fever. Cramps but no localizing pain.  Past Medical History  Diagnosis Date  . Preeclampsia   . Reflux   . Anxiety    Past Surgical History  Procedure Laterality Date  . Cesarean section     Family History  Problem Relation Age of Onset  . Hypertension Mother    History  Substance Use Topics  . Smoking status: Never Smoker   . Smokeless tobacco: Not on file  . Alcohol Use: Yes     Comment: socially   OB History   Grav Para Term Preterm Abortions TAB SAB Ect Mult Living   1 1 0 1 0 0 0 0 0 1      Review of Systems  Constitutional: Negative for fever, chills, diaphoresis, appetite change and fatigue.  HENT: Negative for mouth sores, sore throat and trouble swallowing.   Eyes: Negative for visual disturbance.  Respiratory: Negative for cough, chest tightness, shortness of breath and wheezing.   Cardiovascular: Negative for chest pain.  Gastrointestinal: Positive for abdominal pain and diarrhea. Negative for nausea, vomiting and abdominal distention.       Cramping, non localizing AP.    Endocrine: Negative for polydipsia, polyphagia and polyuria.  Genitourinary: Negative for dysuria, frequency and hematuria.  Musculoskeletal: Negative for gait problem.  Skin: Negative for color change, pallor and rash.  Neurological: Negative for dizziness, syncope, light-headedness and headaches.       Complains of tingling in her lower x-rays.  She has normal sensation. She has normal strength. She is ambulatory to the bathroom within normal gait.  Hematological: Does not bruise/bleed easily.  Psychiatric/Behavioral: Negative for behavioral problems and confusion.      Allergies  Ceclor  Home Medications   Prior to Admission medications   Medication Sig Start Date End Date Taking? Authorizing Provider  dicyclomine (BENTYL) 20 MG tablet Take 1 tablet (20 mg total) by mouth 2 (two) times daily. 04/30/14   Rolland PorterMark Cash Duce, MD  ondansetron (ZOFRAN ODT) 4 MG disintegrating tablet Take 1 tablet (4 mg total) by mouth every 8 (eight) hours as needed for nausea. 04/30/14   Rolland PorterMark Lashaunta Sicard, MD   BP 121/68  Pulse 100  Temp(Src) 98.2 F (36.8 C) (Oral)  Resp 16  SpO2 100%  LMP 03/07/2014 Physical Exam  Constitutional: She is oriented to person, place, and time. She appears well-developed and well-nourished. No distress.  HENT:  Head: Normocephalic.  Eyes: Conjunctivae are normal. Pupils are equal, round, and reactive to light. No scleral icterus.  Neck: Normal range of motion. Neck supple. No thyromegaly present.  Cardiovascular: Normal rate and regular rhythm.  Exam reveals no gallop and no friction rub.   No murmur heard. Pulmonary/Chest: Effort normal and breath sounds normal. No respiratory distress. She has no wheezes. She has no rales.  Abdominal: Soft. Bowel sounds are normal. She exhibits no distension. There  is no tenderness. There is no rebound.  Normal exam of the abdomen. Normal active bowel sounds. No high-pitched rushes. Not a silent abdomen. No focal tenderness or peritoneal irritation.  Musculoskeletal: Normal range of motion.  Neurological: She is alert and oriented to person, place, and time.  Normal strength and sensation in the bilateral extremities. Normal gait. Normal reflexes.  Skin: Skin is warm and dry. No rash noted.  Psychiatric: She has a normal mood and affect. Her behavior is normal.    ED Course  Procedures  (including critical care time) Labs Review Labs Reviewed  CBC WITH DIFFERENTIAL - Abnormal; Notable for the following:    WBC 3.6 (*)    Lymphs Abs 0.5 (*)    All other components within normal limits  COMPREHENSIVE METABOLIC PANEL - Abnormal; Notable for the following:    Potassium 3.6 (*)    All other components within normal limits  URINALYSIS, ROUTINE W REFLEX MICROSCOPIC - Abnormal; Notable for the following:    Specific Gravity, Urine 1.036 (*)    Bilirubin Urine SMALL (*)    Ketones, ur 40 (*)    All other components within normal limits  LIPASE, BLOOD  POC URINE PREG, ED    Imaging Review No results found.   EKG Interpretation None      MDM   Final diagnoses:  Diarrhea    Her white flexion no gross abnormalities. She was given 2 L normal saline. Given morphine IV Zofran IV, by mouth Lomotil. No additional diarrhea here. Discussion is very likely viral syndrome. She has normal strength patient's orders are lower extremity use. I do not think this represents an acute neurological process. Intact strength and reflexes, thus I doubt Guillan Barre.   No back pain or radicular symptoms or pain of any kind thus I doubt cauda equina.  I think the patient is appropriate for discharge home. Rest and stay hydrated. Bentyl for cramps. Lomotil for diarrhea. Recheck with any worsening or heat evolving of her symptoms.    Rolland PorterMark Jeovany Huitron, MD 04/30/14 [phone]

## 2014-04-30 NOTE — ED Notes (Signed)
Pt reports having diarrhea starting last night 1230a. 20+ episodes. +nausea, denies emesis. Has not eaten since yesterday afternoon. Did have a very small amount of etoh last night. Called ems today due to having tingling and numbness from waist down. Pt able to ambulate.

## 2014-04-30 NOTE — ED Notes (Signed)
Pt reports diarrhea since last night. Pt denies vomiting but reports nausea. Pt also reports new onset of tingling waist down.

## 2014-05-30 IMAGING — CR DG CHEST 2V
2 series · 2 of 2 positions shown · non-contrast
Comparison: None.

CLINICAL DATA: Cough and shortness of breath.  Chest congestion.

CHEST - 2 VIEW

[w chest pa]
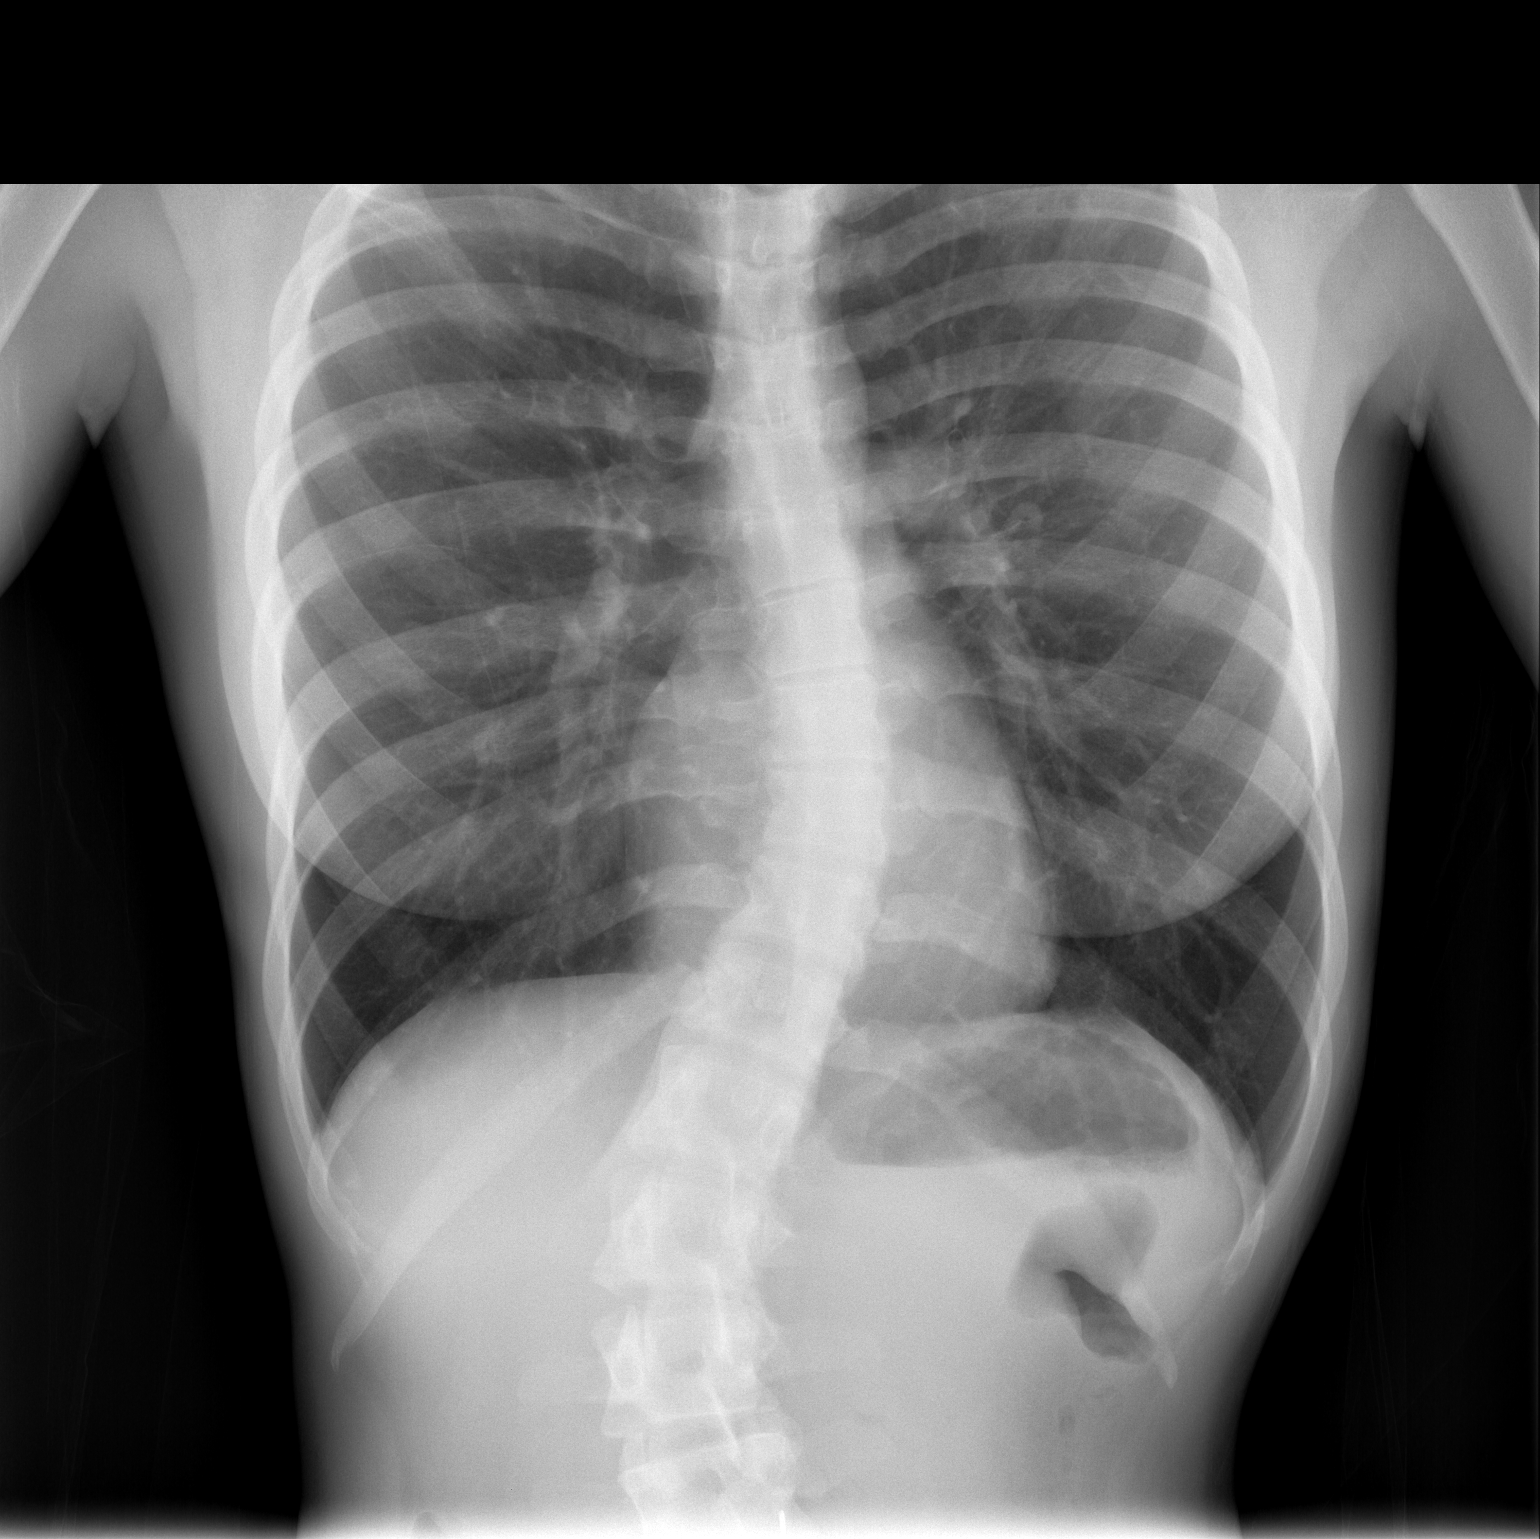

[w chest lat]
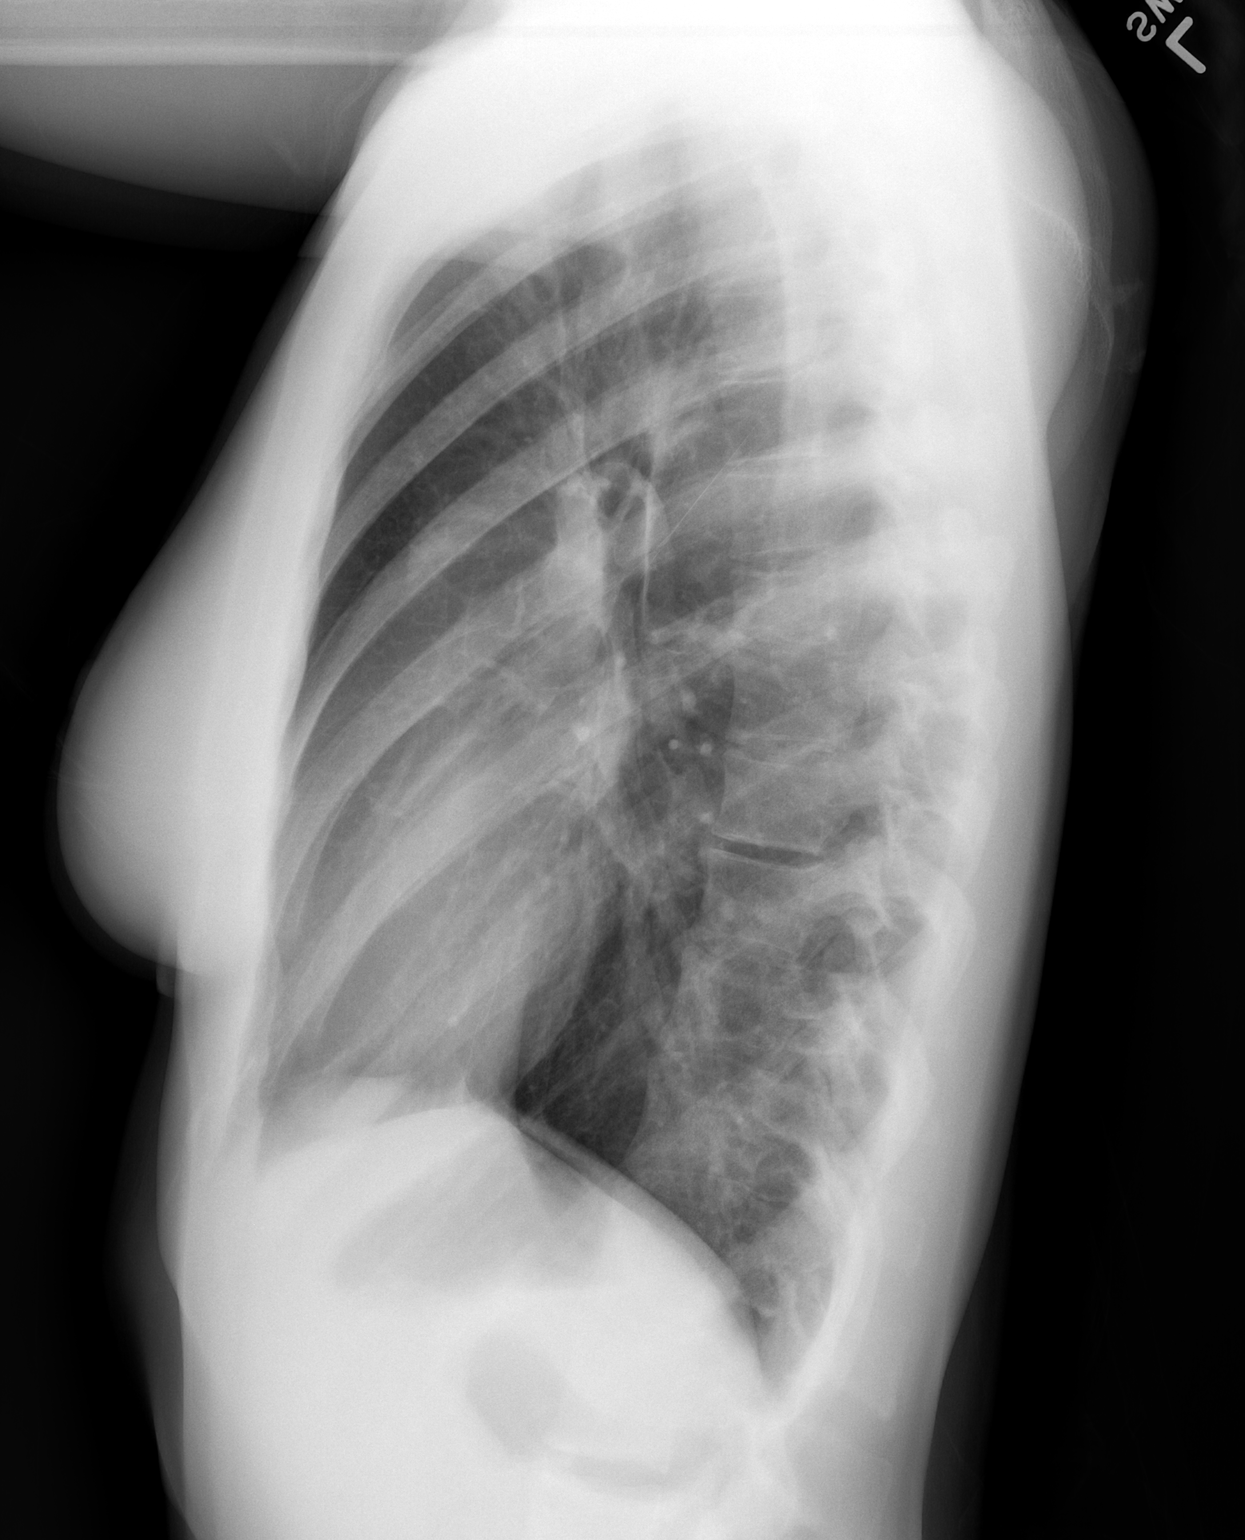

[2 of 2 positions shown; findings below may reference images not displayed]

FINDINGS: Heart size and vascularity are normal and the lungs are
clear.  Moderate thoracolumbar scoliosis.
IMPRESSION: No acute abnormality.

## 2014-10-24 ENCOUNTER — Encounter (HOSPITAL_COMMUNITY): Payer: Self-pay | Admitting: Emergency Medicine
